# Patient Record
Sex: Male | Born: 1965 | Race: White | Hispanic: No | Marital: Married | Smoking: Former smoker
Health system: Southern US, Community
[De-identification: ages and names within clinical notes are randomized; demographics above are authoritative.]

## PROBLEM LIST (undated history)

## (undated) HISTORY — PX: KNEE ARTHROSCOPY WITH ANTERIOR CRUCIATE LIGAMENT (ACL) REPAIR: SHX5644

---

## 1997-08-13 ENCOUNTER — Other Ambulatory Visit: Admission: RE | Admit: 1997-08-13 | Discharge: 1997-08-13 | Payer: Self-pay | Admitting: *Deleted

## 1997-09-10 ENCOUNTER — Other Ambulatory Visit: Admission: RE | Admit: 1997-09-10 | Discharge: 1997-09-10 | Payer: Self-pay | Admitting: *Deleted

## 2011-06-17 ENCOUNTER — Emergency Department (INDEPENDENT_AMBULATORY_CARE_PROVIDER_SITE_OTHER)
Admission: EM | Admit: 2011-06-17 | Discharge: 2011-06-17 | Disposition: A | Discharge status: Home / Self Care | Payer: 59 | Source: Home / Self Care | Attending: Emergency Medicine | Admitting: Emergency Medicine

## 2011-06-17 DIAGNOSIS — R05 Cough: Secondary | ICD-10-CM

## 2011-06-17 DIAGNOSIS — J209 Acute bronchitis, unspecified: Secondary | ICD-10-CM

## 2011-06-17 MED ORDER — GUAIFENESIN-CODEINE 100-10 MG/5ML PO SYRP: 5.0000 mL | ORAL_SOLUTION | Freq: Four times a day (QID) | ORAL | Status: AC | PRN

## 2011-06-17 MED ORDER — CLARITHROMYCIN 500 MG PO TABS: 500.0000 mg | ORAL_TABLET | Freq: Two times a day (BID) | ORAL | Status: AC

## 2011-06-17 NOTE — ED Provider Notes (Signed)
History     CSN: [id number]  Arrival date & time 06/17/11  1015   First MD Initiated Contact with Patient 06/17/11 1028      No chief complaint on file.   (Consider location/radiation/quality/duration/timing/severity/associated sxs/prior treatment) HPI Clayton Romero is a 46 y.o. male who complains of onset of cold symptoms for 10 days.  No sore throat + cough (worsening last 2 days) No pleuritic pain No wheezing + nasal congestion + post-nasal drainage + sinus pain/pressure No chest congestion No itchy/red eyes No earache No hemoptysis No SOB No chills/sweats No fever No nausea No vomiting No abdominal pain No diarrhea No skin rashes No fatigue No myalgias No headache    No past medical history on file.  No past surgical history on file.  No family history on file.  History  Substance Use Topics  . Smoking status: Not on file  . Smokeless tobacco: Not on file  . Alcohol Use: Not on file      Review of Systems  Allergies  Review of patient's allergies indicates not on file.  Home Medications  No current outpatient prescriptions on file.  There were no vitals taken for this visit.  Physical Exam  Nursing note and vitals reviewed. Constitutional: He is oriented to person, place, and time. He appears well-developed and well-nourished.  HENT:  Head: Normocephalic and atraumatic.  Right Ear: Tympanic membrane, external ear and ear canal normal.  Left Ear: Tympanic membrane, external ear and ear canal normal.  Nose: Rhinorrhea present.  Mouth/Throat: Posterior oropharyngeal erythema present. No oropharyngeal exudate or posterior oropharyngeal edema.  Eyes: No scleral icterus.  Neck: Neck supple.  Cardiovascular: Regular rhythm and normal heart sounds.   Pulmonary/Chest: Effort normal and breath sounds normal. No apnea. No respiratory distress. He has no decreased breath sounds. He has no wheezes. He has no rhonchi.  Neurological: He is alert and oriented to  person, place, and time.  Skin: Skin is warm and dry.  Psychiatric: He has a normal mood and affect. His speech is normal.    ED Course  Procedures (including critical care time)  Labs Reviewed - No data to display No results found.   No diagnosis found.    MDM  1)  Take the prescribed antibiotic as instructed.  If worsening, advised to come back to clinic for a chest x-ray. 2)  Use nasal saline solution (over the counter) at least 3 times a day. 3)  Use over the counter decongestants like Zyrtec-D every 12 hours as needed to help with congestion.  If you have hypertension, do not take medicines with sudafed.  4)  Can take tylenol every 6 hours or motrin every 8 hours for pain or fever. 5)  Follow up with your primary doctor if no improvement in 5-7 days, sooner if increasing pain, fever, or new symptoms.      Lily Kocher, MD 06/17/11 [phone]

## 2011-06-17 NOTE — ED Notes (Signed)
Pt c/o productive cough for past 2 weeks.  Pt denies fever.

## 2013-11-01 ENCOUNTER — Encounter: Payer: Self-pay | Admitting: Emergency Medicine

## 2013-11-01 ENCOUNTER — Emergency Department (INDEPENDENT_AMBULATORY_CARE_PROVIDER_SITE_OTHER)
Admission: EM | Admit: 2013-11-01 | Discharge: 2013-11-01 | Disposition: A | Discharge status: Home / Self Care | Payer: 59 | Source: Home / Self Care | Attending: Family Medicine | Admitting: Family Medicine

## 2013-11-01 DIAGNOSIS — J069 Acute upper respiratory infection, unspecified: Secondary | ICD-10-CM

## 2013-11-01 MED ORDER — AZITHROMYCIN 250 MG PO TABS: ORAL_TABLET | ORAL | Status: DC

## 2013-11-01 MED ORDER — BENZONATATE 100 MG PO CAPS: 100.0000 mg | ORAL_CAPSULE | Freq: Three times a day (TID) | ORAL | Status: DC | PRN

## 2013-11-01 NOTE — ED Provider Notes (Signed)
CSN: [id number]     Arrival date & time 11/01/13  1223 History   First MD Initiated Contact with Patient 11/01/13 1241     Chief Complaint  Patient presents with  . URI    HPI  URI Symptoms Onset: 5-7 days  Description: rhinorrhea, nasal congestion, cough  Modifying factors:  + sick contacts at work with similar sxs   Symptoms Nasal discharge: yes Fever: no Sore throat: no Cough: yes Wheezing: no Ear pain: no GI symptoms: no Sick contacts: yes  Red Flags  Stiff neck: no Dyspnea: no Rash: no Swallowing difficulty: no  Sinusitis Risk Factors Headache/face pain: no Double sickening: no tooth pain: no  Allergy Risk Factors Sneezing: no Itchy scratchy throat: no Seasonal symptoms: no  Flu Risk Factors Headache: no muscle aches: no severe fatigue: no   History reviewed. No pertinent past medical history. Past Surgical History  Procedure Laterality Date  . Knee arthroscopy with anterior cruciate ligament (acl) repair     Family History  Problem Relation Age of Onset  . COPD Father    History  Substance Use Topics  . Smoking status: Former Games developermoker  . Smokeless tobacco: Not on file  . Alcohol Use: Yes     Comment: occasionally    Review of Systems  All other systems reviewed and are negative.   Allergies  Review of patient's allergies indicates no known allergies.  Home Medications   Prior to Admission medications   Medication Sig Start Date End Date Taking? Authorizing Provider  dextromethorphan-guaiFENesin (MUCINEX DM) 30-600 MG per 12 hr tablet Take 1 tablet by mouth every 12 (twelve) hours.    Historical Provider, MD  loratadine-pseudoephedrine (CLARITIN-D 24-HOUR) 10-240 MG per 24 hr tablet Take 1 tablet by mouth daily.    Historical Provider, MD   BP 133/89  Pulse 78  Temp(Src) 98.6 F (37 C) (Oral)  Ht 5\' 11"  (1.803 m)  Wt 220 lb (99.791 kg)  BMI 30.70 kg/m2  SpO2 97% Physical Exam  Constitutional: He is oriented to person, place, and  time. He appears well-developed and well-nourished.  HENT:  Head: Normocephalic and atraumatic.  Right Ear: External ear normal.  Left Ear: External ear normal.  +nasal erythema, rhinorrhea bilaterally, + post oropharyngeal erythema    Eyes: Conjunctivae are normal. Pupils are equal, round, and reactive to light.  Neck: Normal range of motion. Neck supple.  Cardiovascular: Normal rate and regular rhythm.   Pulmonary/Chest: Effort normal and breath sounds normal.  Abdominal: Soft.  Musculoskeletal: Normal range of motion.  Neurological: He is alert and oriented to person, place, and time.  Skin: Skin is warm.    ED Course  Procedures (including critical care time) Labs Review Labs Reviewed - No data to display  Imaging Review No results found.   MDM   1. URI (upper respiratory infection)    Likely viral source of sxs  Reassuring exam today Discussed supportive care and infectious/resp red flags.  Tessalon perles for cough ppx rx for zpak if sxs fail to improved in 7-10 days or if sxs worsen.  Follow up as needed.     The patient and/or caregiver has been counseled thoroughly with regard to treatment plan and/or medications prescribed including dosage, schedule, interactions, rationale for use, and possible side effects and they verbalize understanding. Diagnoses and expected course of recovery discussed and will return if not improved as expected or if the condition worsens. Patient and/or caregiver verbalized understanding.  Doree AlbeeSteven Han Vejar, MD 11/01/13 [phone]

## 2013-11-01 NOTE — ED Notes (Signed)
Sx of URI began one week ago with sore throat.  Sx have progressed to productive cough, body aches, head ache.

## 2013-11-03 ENCOUNTER — Telehealth: Payer: Self-pay

## 2013-11-03 NOTE — ED Notes (Signed)
Left a message on voice mail asking how patient is feeling and advising to call back with any questions or concerns.  

## 2014-07-16 ENCOUNTER — Emergency Department (INDEPENDENT_AMBULATORY_CARE_PROVIDER_SITE_OTHER)
Admission: EM | Admit: 2014-07-16 | Discharge: 2014-07-16 | Disposition: A | Discharge status: Home / Self Care | Payer: 59 | Source: Home / Self Care | Attending: Emergency Medicine | Admitting: Emergency Medicine

## 2014-07-16 ENCOUNTER — Encounter: Payer: Self-pay | Admitting: Emergency Medicine

## 2014-07-16 DIAGNOSIS — J111 Influenza due to unidentified influenza virus with other respiratory manifestations: Secondary | ICD-10-CM

## 2014-07-16 DIAGNOSIS — J1189 Influenza due to unidentified influenza virus with other manifestations: Secondary | ICD-10-CM

## 2014-07-16 MED ORDER — OSELTAMIVIR PHOSPHATE 75 MG PO CAPS: ORAL_CAPSULE | ORAL | Status: DC

## 2014-07-16 MED ORDER — BENZONATATE 200 MG PO CAPS: ORAL_CAPSULE | ORAL | Status: DC

## 2014-07-16 NOTE — ED Provider Notes (Signed)
CSN: [id number]     Arrival date & time 07/16/14  1537 History   First MD Initiated Contact with Patient 07/16/14 [phone]     Chief Complaint  Patient presents with  . Influenza   (Consider location/radiation/quality/duration/timing/severity/associated sxs/prior Treatment) HPI   HPI : Acute flu symptoms, onset 5 AM today. Fever to 102 with chills, sweats, myalgias, fatigue, headache. Symptoms are progressively worsening, despite trying OTC fever reducing medicine and rest and fluids. Has decreased appetite, but tolerating some liquids by mouth. No history of recent tick bite. His wife tested positive for influenza 2 days ago.  Review of Systems: Positive for fatigue, mild nasal congestion, mild sore throat, mild swollen anterior neck glands, mild cough, nonproductive. Negative for acute vision changes, stiff neck, focal weakness, syncope, seizures, respiratory distress, vomiting, diarrhea, GU symptoms, new Rash.  History reviewed. No pertinent past medical history. Past Surgical History  Procedure Laterality Date  . Knee arthroscopy with anterior cruciate ligament (acl) repair     Family History  Problem Relation Age of Onset  . COPD Father    History  Substance Use Topics  . Smoking status: Former Games developer  . Smokeless tobacco: Not on file  . Alcohol Use: Yes     Comment: occasionally    Review of Systems  All other systems reviewed and are negative.   Allergies  Review of patient's allergies indicates not on file.  Home Medications   Prior to Admission medications   Medication Sig Start Date End Date Taking? Authorizing Provider  benzonatate (TESSALON) 200 MG capsule Take 1 every 8 hours as needed for cough. 07/16/14   Lajean Manes, MD  oseltamivir (TAMIFLU) 75 MG capsule Starting today, take 1 capsule by mouth twice a day for 5 days. 07/16/14   Lajean Manes, MD   BP 111/79 mmHg  Pulse 85  Temp(Src) 99.1 F (37.3 C) (Oral)  Ht  (1.803 m)  Wt 214 lb (97.07 kg)   BMI 29.86 kg/m2  SpO2 99% Physical Exam  Constitutional: He appears well-developed and well-nourished.  Non-toxic appearance. He appears ill (very fatigued, but no cardiorespiratory distress). No distress.  HENT:  Head: Normocephalic and atraumatic.  Right Ear: Tympanic membrane and external ear normal.  Left Ear: Tympanic membrane and external ear normal.  Nose: Rhinorrhea present.  Mouth/Throat: Mucous membranes are normal. Posterior oropharyngeal erythema (mild redness ) present. No oropharyngeal exudate.  Eyes: Conjunctivae are normal. Right eye exhibits no discharge. Left eye exhibits no discharge. No scleral icterus.  Neck: Neck supple.  Cardiovascular: Normal rate, regular rhythm and normal heart sounds.   Pulmonary/Chest: Breath sounds normal. No stridor. No respiratory distress. He has no wheezes. He has no rales.  Abdominal: Soft. There is no tenderness.  Musculoskeletal: He exhibits no edema.  Lymphadenopathy:    He has cervical adenopathy (mild shoddy anterior cervical nodes).  Neurological: He is alert.  Skin: Skin is warm and intact. No rash noted. He is diaphoretic.  Psychiatric: He has a normal mood and affect.  Nursing note and vitals reviewed.   ED Course  Procedures (including critical care time) Labs Review Labs Reviewed - No data to display  Imaging Review No results found.   MDM   1. Influenza with respiratory manifestations    Options discussed. He declined any testing today. Treatment options discussed, as well as risks, benefits, alternatives. Patient voiced understanding and agreement with the following plans: benzonatate (TESSALON) 200 MG capsule Take 1 every 8 hours as needed for cough. 15 capsule  oseltamivir (TAMIFLU) 75 MG capsule Starting today, take 1 capsule by mouth twice a day for 5 days.   Follow-up with your primary care doctor in 5-7 days if not improving, or sooner if symptoms become worse. Precautions discussed. Red flags  discussed. Questions invited and answered. Patient voiced understanding and agreement.      Lajean Manesavid Massey, MD 07/16/14 [phone]

## 2014-07-16 NOTE — ED Notes (Signed)
This morning started feeling fatigue, cough, congestion, body aches, wife tested positive for flu 2 days ago

## 2018-06-08 DEATH — deceased

## 2019-12-10 ENCOUNTER — Other Ambulatory Visit: Payer: Self-pay

## 2019-12-10 ENCOUNTER — Emergency Department (INDEPENDENT_AMBULATORY_CARE_PROVIDER_SITE_OTHER)
Admission: EM | Admit: 2019-12-10 | Discharge: 2019-12-10 | Disposition: A | Discharge status: Home / Self Care | Payer: 59 | Source: Home / Self Care | Attending: Emergency Medicine | Admitting: Emergency Medicine

## 2019-12-10 DIAGNOSIS — R059 Cough, unspecified: Secondary | ICD-10-CM

## 2019-12-10 DIAGNOSIS — R05 Cough: Secondary | ICD-10-CM | POA: Diagnosis not present

## 2019-12-10 DIAGNOSIS — J209 Acute bronchitis, unspecified: Secondary | ICD-10-CM

## 2019-12-10 MED ORDER — PREDNISONE 10 MG (21) PO TBPK: ORAL_TABLET | ORAL | 0 refills | Status: DC

## 2019-12-10 MED ORDER — AZITHROMYCIN 250 MG PO TABS: ORAL_TABLET | ORAL | 0 refills | Status: DC

## 2019-12-10 NOTE — ED Triage Notes (Signed)
Patient presents to Urgent Care with complaints of cough since about a week ago. Patient reports it started when he was working outside when all the smoke from the [employer] was in the air. Pt has been trying otc medications but it has not helped his cough, is worse at night, nonproductive.

## 2019-12-10 NOTE — ED Provider Notes (Addendum)
[facility]    CSN: [id number] Arrival date & time: 12/10/19  1132      History   Chief Complaint Chief Complaint  Patient presents with  . Cough    HPI Clayton Romero is a 54 y.o. male.   HPI Patient presents to Urgent Care with complaints of cough since about a week ago. He  reports it started when he was working outside when all the smoke from the [employer] was in the air.  Started with chest congestion, frequent hacking nonproductive cough, but is occasionally productive of brownish-yellow sputum.  Chest muscles feel sore from coughing but no exertional chest pain.  No shortness of breath.   Pt has been trying otc medications but it has not helped his cough, is worse at night.   No change in taste or smell.   + Low-grade fever.  No definite chills or sweats. + myalgias, fatigue, mild nonfocal nonspecific headache.  Symptoms are progressively worsening, despite trying OTC fever reducing medicine and rest and fluids.  Has decreased appetite, but tolerating some liquids by mouth.  No history of recent tick bite. No history of travel within the last 2 weeks. No history of exposure to anyone with documented COVID-19  Past medical history: Denies history of chronic lung disease. Does not smoke.  Review of Systems  No  shortness of breath No  respiratory distress  + fatigue + Minimal nasal congestion + No sore throat + No swollen or painful neck glands  No  vomiting No  diarrhea No  acute vision changes No  stiff neck No  focal weakness No  syncope No  seizures No  GU symptoms No  new rash   Pertinent items noted in HPI and remainder of comprehensive ROS otherwise negative.   History reviewed. No pertinent past medical history.  There are no problems to display for this patient.   Past Surgical History:  Procedure Laterality Date  . KNEE ARTHROSCOPY WITH ANTERIOR CRUCIATE LIGAMENT (ACL) REPAIR         Home Medications     Prior to Admission medications   Medication Sig Start Date End Date Taking? Authorizing Provider  azithromycin (ZITHROMAX Z-PAK) 250 MG tablet Take 2 tablets on day one, then 1 tablet daily on days 2 through 5 12/10/19   Lajean Manes, MD  predniSONE (STERAPRED UNI-PAK 21 TAB) 10 MG (21) TBPK tablet Take tapering dosage over 6 days as directed 12/10/19   Lajean Manes, MD    Family History Family History  Problem Relation Age of Onset  . COPD Father   . Cancer Father   . Healthy Mother     Social History Social History   Tobacco Use  . Smoking status: Former Games developer  . Smokeless tobacco: Never Used  Substance Use Topics  . Alcohol use: Yes    Comment: occasionally  . Drug use: No     Allergies   Patient has no known allergies.   Review of Systems Review of Systems   Physical Exam Triage Vital Signs ED Triage Vitals  Enc Vitals Group     BP 12/10/19 1223 (!) 161/96     Pulse Rate 12/10/19 1223 84     Resp 12/10/19 1223 16     Temp 12/10/19 1223 99.7 F (37.6 C)     Temp Source 12/10/19 1223 Oral     SpO2 12/10/19 1223 99 %     Weight --      Height --  Head Circumference --      Peak Flow --      Pain Score 12/10/19 1220 0     Pain Loc --      Pain Edu? --      Excl. in GC? --    No data found.  Updated Vital Signs BP (!) 161/96 (BP Location: Right Arm)   Pulse 84   Temp 99.7 F (37.6 C) (Oral)   Resp 16   SpO2 99%    Physical Exam Vitals and nursing note reviewed.  BP repeated 148/92. Constitutional:      General: He is not in acute distress.    Appearance: He is well-developed. He is ill-appearing (very fatigued, but no cardiorespiratory distress) and mildly diaphoretic. He is not toxic-appearing.  Occasional hacking cough noted. HENT:     Head: Normocephalic and atraumatic.     Right Ear: Tympanic membrane and external ear normal.     Left Ear: Tympanic membrane and external ear normal.     Nose: Minimal clearish rhinorrhea present.      Mouth/Throat:     Pharynx: No posterior oropharyngeal erythema present. No oropharyngeal exudate.  Eyes:     General: No scleral icterus.       Right eye: No discharge.        Left eye: No discharge.     Conjunctiva/sclera: Conjunctivae normal.  Cardiovascular:     Rate and Rhythm: Normal rate and regular rhythm.     Heart sounds: Normal heart sounds.  No murmur.  No gallop or rub. Pulmonary: Oxygen saturation 99% room air.  Occasional harsh hacking cough noted.    Effort: No respiratory distress.     Breath sounds: Few scattered rhonchi bilaterally.  Breath sounds equal with good air movement bilaterally.  No stridor. No wheezing or rales.  Abdominal:     Palpations: Abdomen is soft.     Tenderness: There is no abdominal tenderness.  Musculoskeletal:     Cervical back: Neck supple.  Nontender.  No cyanosis clubbing edema or tenderness of lower extremities. Lymphadenopathy:     Cervical: Minimal cervical adenopathy (mild shoddy anterior cervical nodes) present.  Skin:    General: Skin is warm.     Findings: No rash.  Capillary refill normal. Neurological:     Mental Status: He is alert.  Cranial nerves intact.    UC Treatments / Results  Labs (all labs ordered are listed, but only abnormal results are displayed) Labs Reviewed  SARS CORONAVIRUS 2 (TAT 6-24 HRS)    Radiology No results found.  Procedures Procedures (including critical care time)  Medications Ordered in UC Medications - No data to display  Initial Impression / Assessment and Plan / UC Course  I have reviewed the triage vital signs and the nursing notes.  Pertinent labs & imaging results that were available during my care of the patient were reviewed by me and considered in my medical decision making (see chart for details).     Please see details of assessment and plan below in the discharge instructions. Final Clinical Impressions(s) / UC Diagnoses   Final diagnoses:  Cough  Acute bronchitis,  unspecified organism  Febrile illness.  Temp 99.7 here in urgent care.   Discharge Instructions     Based on your history and physical exam, you most likely have acute bronchitis, most likely from combination of [place]  And likely bacterial bronchitis.  No evidence of pneumonia after  listening to your lungs.-No chest x-ray is indicated at this time. Also, it is possible this could be Covid and the only way to tell for sure is to test. Plans: We are sending off a PCR nasal swab for Covid test, results hopefully should be back within 2 days.-In the meantime, I advise you quarantine.  Please see attached instruction sheet on "prevent the spread of COVID-19 if you are sick".  Also, see attached instruction sheet on acute bronchitis. -I am sending 2 prescriptions to your pharmacy.  Zithromax Z-Pak, which is an antibiotic.  Take as directed. Prednisone-6-day Dosepak which should help cough and lung inflammation. You may use over-the-counter Robitussin-DM if needed for cough syrup. Rest and drink plenty of fluids. You may return to work on Monday, 12/15/2019 if you are feeling better and if Covid test is negative.   He declined any prescription cough med.  He prefers to use OTC cough medicine as needed. Precautions discussed. Red flags discussed. Questions invited and answered. Patient voiced understanding and agreement.   ED Prescriptions    Medication Sig Dispense Auth. Provider   azithromycin (ZITHROMAX Z-PAK) 250 MG tablet Take 2 tablets on day one, then 1 tablet daily on days 2 through 5 1 each Lajean Manes, MD   predniSONE (STERAPRED UNI-PAK 21 TAB) 10 MG (21) TBPK tablet Take tapering dosage over 6 days as directed 21 tablet Lajean Manes, MD     PDMP not reviewed this encounter.   Lajean Manes, MD 12/10/19 Izola Price    Lajean Manes, MD 12/10/19 [phone]

## 2019-12-10 NOTE — Discharge Instructions (Addendum)
Based on your history and physical exam, you most likely have acute bronchitis, most likely from combination of [place]  And likely bacterial bronchitis.  No evidence of pneumonia after listening to your lungs.-No chest x-ray is indicated at this time. Also, it is possible this could be Covid and the only way to tell for sure is to test. Plans: We are sending off a PCR nasal swab for Covid test, results hopefully should be back within 2 days.-In the meantime, I advise you quarantine.  Please see attached instruction sheet on "prevent the spread of COVID-19 if you are sick".  Also, see attached instruction sheet on acute bronchitis. -I am sending 2 prescriptions to your pharmacy.  Zithromax Z-Pak, which is an antibiotic.  Take as directed. Prednisone-6-day Dosepak which should help cough and lung inflammation. You may use over-the-counter Robitussin-DM if needed for cough syrup. Rest and drink plenty of fluids. You may return to work on Monday, 12/15/2019 if you are feeling better and if Covid test is negative.

## 2019-12-11 LAB — SARS-COV-2 RNA,(COVID-19) QUALITATIVE NAAT: SARS CoV2 RNA: NOT DETECTED

## 2020-04-19 ENCOUNTER — Ambulatory Visit (INDEPENDENT_AMBULATORY_CARE_PROVIDER_SITE_OTHER): Payer: 59 | Admitting: Sports Medicine

## 2020-04-19 ENCOUNTER — Ambulatory Visit (INDEPENDENT_AMBULATORY_CARE_PROVIDER_SITE_OTHER): Payer: 59

## 2020-04-19 ENCOUNTER — Other Ambulatory Visit: Payer: Self-pay

## 2020-04-19 ENCOUNTER — Telehealth: Payer: Self-pay | Admitting: Sports Medicine

## 2020-04-19 DIAGNOSIS — M1731 Unilateral post-traumatic osteoarthritis, right knee: Secondary | ICD-10-CM

## 2020-04-19 DIAGNOSIS — M25461 Effusion, right knee: Secondary | ICD-10-CM | POA: Diagnosis not present

## 2020-04-19 DIAGNOSIS — S83281A Other tear of lateral meniscus, current injury, right knee, initial encounter: Secondary | ICD-10-CM

## 2020-04-19 DIAGNOSIS — S83241A Other tear of medial meniscus, current injury, right knee, initial encounter: Secondary | ICD-10-CM

## 2020-04-19 DIAGNOSIS — M7121 Synovial cyst of popliteal space [Baker], right knee: Secondary | ICD-10-CM | POA: Diagnosis not present

## 2020-04-19 NOTE — Progress Notes (Signed)
    Procedures performed today:    Procedure: Real-time Ultrasound Guided aspiration/injection of right knee Device: Samsung [id number]  Verbal informed consent obtained.  Time-out conducted.  Noted no overlying erythema, induration, or other signs of local infection.  Skin prepped in a sterile fashion.  Local anesthesia: Topical Ethyl chloride.  With sterile technique and under real time ultrasound guidance:  Using 18-gauge needle aspirated approximately 20 mils of clear, straw-colored fluid, syringe switched and 1 cc Kenalog 40, 2 cc lidocaine, 2 cc bupivacaine injected easily Completed without difficulty  Advised to call if fevers/chills, erythema, induration, drainage, or persistent bleeding.  Images permanently stored and available for review in PACS.  Impression: Technically successful ultrasound guided injection.  Independent interpretation of notes and tests performed by another provider:   None.  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 He was seen at an outside orthopedic group, steroid injection did not provide significant relief, this was over 2 months ago. Today he has a significant effusion so today we did a repeat aspiration and injection. X-rays did show significant osteoarthritis as well as the sequelae of his ACL reconstruction. He has also having some mechanical symptoms, due to mechanical symptoms, pain, recent x-rays, and symptoms after greater than 6 weeks of physician directed conservative measures we are going to proceed with MRI of the knee today. Return to see me in approximately a month, I am also going to work on getting him approved for viscosupplementation. He understands if all of the above fails we will consider referral for  arthroplasty.    ___________________________________________ Ihor Austin. Benjamin Stain, M.D., ABFM., CAQSM. Primary Care and Sports Medicine [facility]  Adjunct Instructor of Family Medicine  [employer]

## 2020-04-19 NOTE — Assessment & Plan Note (Signed)
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 He was seen at an outside orthopedic group, steroid injection did not provide significant relief, this was over 2 months ago. Today he has a significant effusion so today we did a repeat aspiration and injection. X-rays did show significant osteoarthritis as well as the sequelae of his ACL reconstruction. He has also having some mechanical symptoms, due to mechanical symptoms, pain, recent x-rays, and symptoms after greater than 6 weeks of physician directed conservative measures we are going to proceed with MRI of the knee today. Return to see me in approximately a month, I am also going to work on getting him approved for viscosupplementation. He understands if all of the above fails we will consider referral for arthroplasty.

## 2020-04-19 NOTE — Telephone Encounter (Signed)
Please work on approval for viscosupplementation, be it Orthovisc or any of the other preferred agents, right knee, x-ray confirmed, failed steroid injections

## 2020-04-23 ENCOUNTER — Telehealth: Payer: Self-pay | Admitting: Sports Medicine

## 2020-04-23 NOTE — Telephone Encounter (Signed)
Peer-to-peer performed, MRI of the knee approved, approval #[id number].

## 2020-04-29 NOTE — Telephone Encounter (Signed)
Started Orthovisc process waiting on BID and to see if prior authorization is required. - CF 

## 2020-05-03 NOTE — Telephone Encounter (Signed)
I called Clayton Romero and left him a voicemail to call me so we can go over his orthovisc. I received the benefits investigation and he will gave to pay for the injection and it requires a PA. - CF

## 2020-05-10 ENCOUNTER — Emergency Department: Admit: 2020-05-10 | Discharge status: Absent without leave | Payer: Self-pay

## 2020-05-10 ENCOUNTER — Telehealth: Payer: Self-pay

## 2020-05-10 ENCOUNTER — Telehealth: Payer: Self-pay | Admitting: Emergency Medicine

## 2020-05-10 ENCOUNTER — Other Ambulatory Visit: Payer: Self-pay

## 2020-05-10 ENCOUNTER — Emergency Department (INDEPENDENT_AMBULATORY_CARE_PROVIDER_SITE_OTHER)
Admission: EM | Admit: 2020-05-10 | Discharge: 2020-05-10 | Disposition: A | Discharge status: Home / Self Care | Payer: 59 | Source: Home / Self Care

## 2020-05-10 ENCOUNTER — Emergency Department (INDEPENDENT_AMBULATORY_CARE_PROVIDER_SITE_OTHER): Payer: 59

## 2020-05-10 DIAGNOSIS — U071 COVID-19: Secondary | ICD-10-CM

## 2020-05-10 DIAGNOSIS — J189 Pneumonia, unspecified organism: Secondary | ICD-10-CM | POA: Diagnosis not present

## 2020-05-10 DIAGNOSIS — J1282 Pneumonia due to coronavirus disease 2019: Secondary | ICD-10-CM

## 2020-05-10 LAB — POC SARS CORONAVIRUS 2 AG -  ED: SARS Coronavirus 2 Ag: POSITIVE — AB

## 2020-05-10 MED ORDER — ALBUTEROL SULFATE HFA 108 (90 BASE) MCG/ACT IN AERS: 1.0000 | INHALATION_SPRAY | Freq: Four times a day (QID) | RESPIRATORY_TRACT | 0 refills | Status: DC | PRN

## 2020-05-10 MED ORDER — ONDANSETRON 4 MG PO TBDP: 4.0000 mg | ORAL_TABLET | Freq: Three times a day (TID) | ORAL | 0 refills | Status: AC | PRN

## 2020-05-10 MED ORDER — DOXYCYCLINE HYCLATE 100 MG PO CAPS: 100.0000 mg | ORAL_CAPSULE | Freq: Two times a day (BID) | ORAL | 0 refills | Status: AC

## 2020-05-10 MED ORDER — PREDNISONE 50 MG PO TABS: 50.0000 mg | ORAL_TABLET | Freq: Every day | ORAL | 0 refills | Status: AC

## 2020-05-10 MED ORDER — ALBUTEROL SULFATE HFA 108 (90 BASE) MCG/ACT IN AERS: 1.0000 | INHALATION_SPRAY | Freq: Four times a day (QID) | RESPIRATORY_TRACT | 0 refills | Status: AC | PRN

## 2020-05-10 MED ORDER — ALBUTEROL SULFATE (2.5 MG/3ML) 0.083% IN NEBU: 2.5000 mg | INHALATION_SOLUTION | Freq: Four times a day (QID) | RESPIRATORY_TRACT | 12 refills | Status: DC | PRN

## 2020-05-10 MED ORDER — ACETAMINOPHEN 325 MG PO TABS
650.0000 mg | ORAL_TABLET | Freq: Four times a day (QID) | ORAL | Status: DC | PRN
  Administered 2020-05-10 (×2): 650 mg via ORAL

## 2020-05-10 MED ORDER — ALBUTEROL SULFATE (5 MG/ML) 0.5% IN NEBU: 2.5000 mg | INHALATION_SOLUTION | Freq: Four times a day (QID) | RESPIRATORY_TRACT | 12 refills | Status: AC | PRN

## 2020-05-10 NOTE — Discharge Instructions (Signed)
°  Because your pneumonia is due to the COVID virus, the antibiotic may or may not help but it is important to finish the entire course, even if you start to feel better to cover for bacteria infections.  A referral for an antibody infusion clinic has been placed. Someone should call you within the next 48 hours to discuss this potential treatment with you.   Call 911 or have someone drive you to the hospital if symptoms significantly worsening- worsening trouble breathing, dizziness/passing out, unable to keep down fluids or other new concerning symptoms develop.

## 2020-05-10 NOTE — ED Provider Notes (Addendum)
[facility]    CSN: [id number] Arrival date & time: 05/10/20  1041      History   Chief Complaint Chief Complaint  Patient presents with  . Cough    HPI Clayton Romero is a 55 y.o. male.   HPI  Clayton Romero is a 56 y.o. male presenting to UC with c/o 3 day of fever Tmax 101*F, preceded a few days earlier of mild cough and fatigue.  Pt c/o body aches, fatigue, SOB with exertion, stating his breathing "is not the best in the world right now."  He has taken OTC cough/cold medication with mild relief.  He was exposed to his brother who tested positive for COVID recently. Pt has not received the COVID or flu vaccines.  No hx of asthma but states "my lungs don't always work the best." father had COPD.  Pt is a former smoker.   History reviewed. No pertinent past medical history.  Patient Active Problem List   Diagnosis Date Noted  . Post-traumatic osteoarthritis of right knee 04/19/2020    Past Surgical History:  Procedure Laterality Date  . KNEE ARTHROSCOPY WITH ANTERIOR CRUCIATE LIGAMENT (ACL) REPAIR         Home Medications    Prior to Admission medications   Medication Sig Start Date End Date Taking? Authorizing Provider  albuterol (PROVENTIL) (2.5 MG/3ML) 0.083% nebulizer solution Take 3 mLs (2.5 mg total) by nebulization every 6 (six) hours as needed for wheezing or shortness of breath. 05/10/20  Yes Dorenda Pfannenstiel O, PA-C  doxycycline (VIBRAMYCIN) 100 MG capsule Take 1 capsule (100 mg total) by mouth 2 (two) times daily for 7 days. 05/10/20 05/17/20 Yes Muneeb Veras O, PA-C  ondansetron (ZOFRAN-ODT) 4 MG disintegrating tablet Take 1 tablet (4 mg total) by mouth every 8 (eight) hours as needed for nausea or vomiting. 05/10/20  Yes Omarion Minnehan O, PA-C  predniSONE (DELTASONE) 50 MG tablet Take 1 tablet (50 mg total) by mouth daily with breakfast for 5 days. 05/10/20 05/15/20 Yes Lurene Shadow, PA-C    Family History Family History  Problem Relation Age of  Onset  . COPD Father   . Cancer Father   . Healthy Mother     Social History Social History   Tobacco Use  . Smoking status: Former Games developer  . Smokeless tobacco: Never Used  Substance Use Topics  . Alcohol use: Yes    Comment: occasionally  . Drug use: No     Allergies   Patient has no known allergies.   Review of Systems Review of Systems  Constitutional: Positive for fatigue and fever. Negative for chills.  HENT: Positive for congestion. Negative for ear pain, sore throat, trouble swallowing and voice change.   Respiratory: Positive for cough, chest tightness and shortness of breath (intermittent).   Cardiovascular: Negative for chest pain and palpitations.  Gastrointestinal: Negative for abdominal pain, diarrhea, nausea and vomiting.  Musculoskeletal: Positive for arthralgias, back pain and myalgias.  Skin: Negative for rash.  Neurological: Positive for headaches. Negative for dizziness and light-headedness.  All other systems reviewed and are negative.    Physical Exam Triage Vital Signs ED Triage Vitals  Enc Vitals Group     BP 05/10/20 1050 130/76     Pulse Rate 05/10/20 1050 (!) 114     Resp 05/10/20 1050 18     Temp 05/10/20 1050 (!) 100.9 F (38.3 C)     Temp Source 05/10/20 1050 Oral     SpO2  05/10/20 1050 99 %     Weight --      Height --      Head Circumference --      Peak Flow --      Pain Score 05/10/20 1048 0     Pain Loc --      Pain Edu? --      Excl. in GC? --    No data found.  Updated Vital Signs BP 130/76 (BP Location: Right Arm)   Pulse (!) 114   Temp (!) 100.9 F (38.3 C) (Oral)   Resp 18   Ht 5\' 11"  (1.803 m)   Wt 230 lb (104.3 kg)   SpO2 99%   BMI 32.08 kg/m   Visual Acuity Right Eye Distance:   Left Eye Distance:   Bilateral Distance:    Right Eye Near:   Left Eye Near:    Bilateral Near:     Physical Exam Vitals and nursing note reviewed.  Constitutional:      General: He is not in acute distress.     Appearance: Normal appearance. He is well-developed and well-nourished. He is not ill-appearing, toxic-appearing or diaphoretic.  HENT:     Head: Normocephalic and atraumatic.     Right Ear: Tympanic membrane and ear canal normal.     Left Ear: Tympanic membrane and ear canal normal.     Nose: Nose normal.     Right Sinus: No maxillary sinus tenderness or frontal sinus tenderness.     Left Sinus: No maxillary sinus tenderness or frontal sinus tenderness.     Mouth/Throat:     Lips: Pink.     Mouth: Mucous membranes are moist.     Pharynx: Oropharynx is clear. Uvula midline.  Eyes:     Extraocular Movements: EOM normal.  Cardiovascular:     Rate and Rhythm: Regular rhythm. Tachycardia present.  Pulmonary:     Effort: Pulmonary effort is normal. No respiratory distress.     Breath sounds: No stridor. Wheezing (right middle lobe) and rhonchi present. No rales.  Musculoskeletal:        General: Normal range of motion.     Cervical back: Normal range of motion.  Skin:    General: Skin is warm and dry.  Neurological:     Mental Status: He is alert and oriented to person, place, and time.  Psychiatric:        Mood and Affect: Mood and affect normal.        Behavior: Behavior normal.      UC Treatments / Results  Labs (all labs ordered are listed, but only abnormal results are displayed) Labs Reviewed  POC SARS CORONAVIRUS 2 AG -  ED - Abnormal; Notable for the following components:      Result Value   SARS Coronavirus 2 Ag Positive (*)    All other components within normal limits    EKG   Radiology DG Chest 2 View  Result Date: 05/10/2020 CLINICAL DATA:  Cough and congestion for several days, history of known COVID exposure, positive COVID test today. EXAM: CHEST - 2 VIEW COMPARISON:  None. FINDINGS: Cardiac shadow is within normal limits. Patchy airspace opacities are noted bilaterally consistent with multifocal pneumonia. No sizable effusion is seen. No bony abnormality is  noted. IMPRESSION: Patchy bilateral airspace opacities consistent with the given clinical history of COVID-19 positivity. Electronically Signed   By: 07/08/2020 M.D.   On: 05/10/2020 11:29    Procedures Procedures (including critical  care time)  Medications Ordered in UC Medications  acetaminophen (TYLENOL) tablet 650 mg (650 mg Oral Given 05/10/20 1150)    Initial Impression / Assessment and Plan / UC Course  I have reviewed the triage vital signs and the nursing notes.  Pertinent labs & imaging results that were available during my care of the patient were reviewed by me and considered in my medical decision making (see chart for details).    Pt reports intermittent SOB but no respiratory distress noted on exam O2 Sat 99% on RA Rapid COVID: POSITIVE Will cover for secondary bacterial pneumonia Referral to [place] clinic placed, pt open to discussing this treatment. Discussed symptoms that warrant emergent care in the ED. AVS given  Final Clinical Impressions(s) / UC Diagnoses   Final diagnoses:  Multifocal pneumonia  Pneumonia due to COVID-19 virus     Discharge Instructions      Because your pneumonia is due to the COVID virus, the antibiotic may or may not help but it is important to finish the entire course, even if you start to feel better to cover for bacteria infections.  A referral for an antibody infusion clinic has been placed. Someone should call you within the next 48 hours to discuss this potential treatment with you.   Call 911 or have someone drive you to the hospital if symptoms significantly worsening- worsening trouble breathing, dizziness/passing out, unable to keep down fluids or other new concerning symptoms develop.      ED Prescriptions    Medication Sig Dispense Auth. Provider   doxycycline (VIBRAMYCIN) 100 MG capsule Take 1 capsule (100 mg total) by mouth 2 (two) times daily for 7 days. 14 capsule Gerarda Fraction, Johana Hopkinson O, PA-C   predniSONE (DELTASONE) 50  MG tablet Take 1 tablet (50 mg total) by mouth daily with breakfast for 5 days. 5 tablet Gerarda Fraction, Lenell Lama O, PA-C   albuterol (PROVENTIL) (2.5 MG/3ML) 0.083% nebulizer solution Take 3 mLs (2.5 mg total) by nebulization every 6 (six) hours as needed for wheezing or shortness of breath. 75 mL Blain Hunsucker O, PA-C   ondansetron (ZOFRAN-ODT) 4 MG disintegrating tablet Take 1 tablet (4 mg total) by mouth every 8 (eight) hours as needed for nausea or vomiting. 12 tablet Noe Gens, [place]     PDMP not reviewed this encounter.   Noe Gens, PA-C 05/10/20 1203    Noe Gens, [place] 05/10/20 1203

## 2020-05-10 NOTE — Telephone Encounter (Signed)
Printer error. Printed successfully on prescription paper.

## 2020-05-10 NOTE — Telephone Encounter (Signed)
[facility]ebulizer called into [facility] in [place] for patient.

## 2020-05-10 NOTE — Telephone Encounter (Signed)
Nebulizer solution inadvertantly sent to pharmacy, pt does not have nebulizer machine. Albuterol inhaler sent to [facility] on [address] in [place].

## 2020-05-10 NOTE — Telephone Encounter (Signed)
Albuterol not at patient's preferred pharmacy. Prescription printed for pt.

## 2020-05-10 NOTE — ED Triage Notes (Signed)
Patient presents to Urgent Care with complaints of intermittent fever and cough since 3 days ago. Patient reports he feels like his breathing "is not the best in the world right now". Pt states he has been taking various medications at home, was exposed to his brother who has covid.

## 2020-05-11 ENCOUNTER — Telehealth: Payer: Self-pay | Admitting: Emergency Medicine

## 2020-05-11 NOTE — Telephone Encounter (Signed)
Call from patient Clayton Romero) regarding nebulizer- confirmed identifiers. RN had spoken to pharmacy tech at [facility] earlier who stated they had the nebulizer but would not be able to charge for machine via insurance. RN spoke to patient and determined it would be easier for patient to pick up nebulizer from [facility] - form filled out by RN & patient on picking up nebulizer today. Forms placed on desk for provider to sign. Forms to be faxed back to [facility]

## 2020-05-12 ENCOUNTER — Telehealth: Payer: Self-pay | Admitting: Family

## 2020-05-12 NOTE — Telephone Encounter (Signed)
Called to discuss with patient about COVID-19 symptoms and the use of one of the available treatments for those with mild to moderate Covid symptoms and at a high risk of hospitalization.  Pt appears to qualify for outpatient treatment due to co-morbid conditions and/or a member of an at-risk group in accordance with the FDA Emergency Use Authorization.    Symptom onset: More than 1 week ago Vaccinated: No Qualifiers: BMI>25  I spoke with Mr. Buch regarding potential treatment with EUA medications. Due to symptoms starting over 1 week ago, he currently does not meet the requirements for treatment. He also expressed increasing difficulty with breathing and worsening symptoms. He was encouraged to seek further care in the ED due to his shortness of breath.   Marcos Eke, NP 05/12/2020 10:04 AM

## 2020-05-17 ENCOUNTER — Ambulatory Visit: Payer: 59 | Admitting: Sports Medicine

## 2020-06-08 DEATH — deceased

## 2022-02-05 IMAGING — DX DG CHEST 2V
2 series · 2 of 2 positions shown · non-contrast
Comparison: None.

CLINICAL DATA: Cough and congestion for several days, history of
known COVID exposure, positive COVID test today.

EXAM:
CHEST - 2 VIEW

[chest pa]
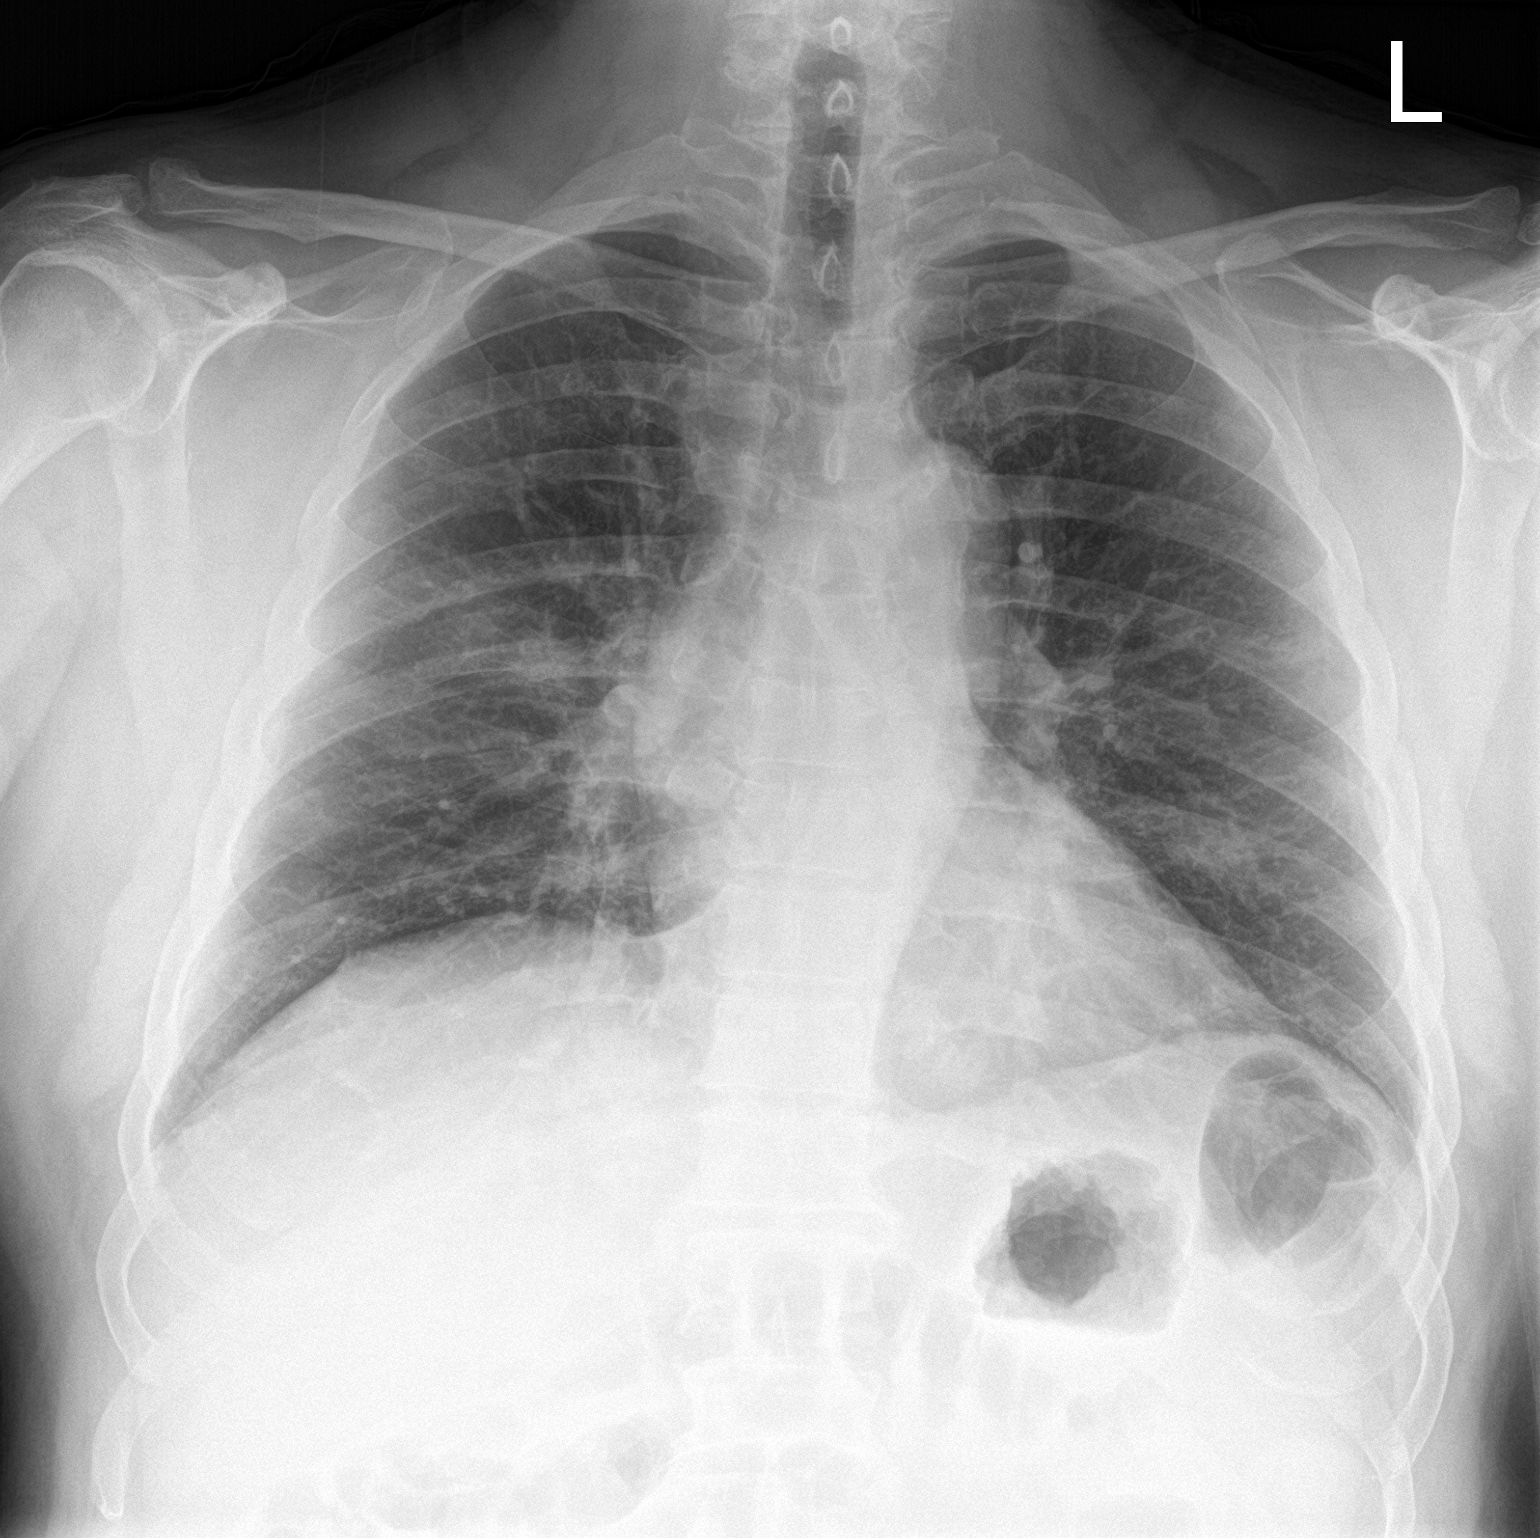

[chest lat]
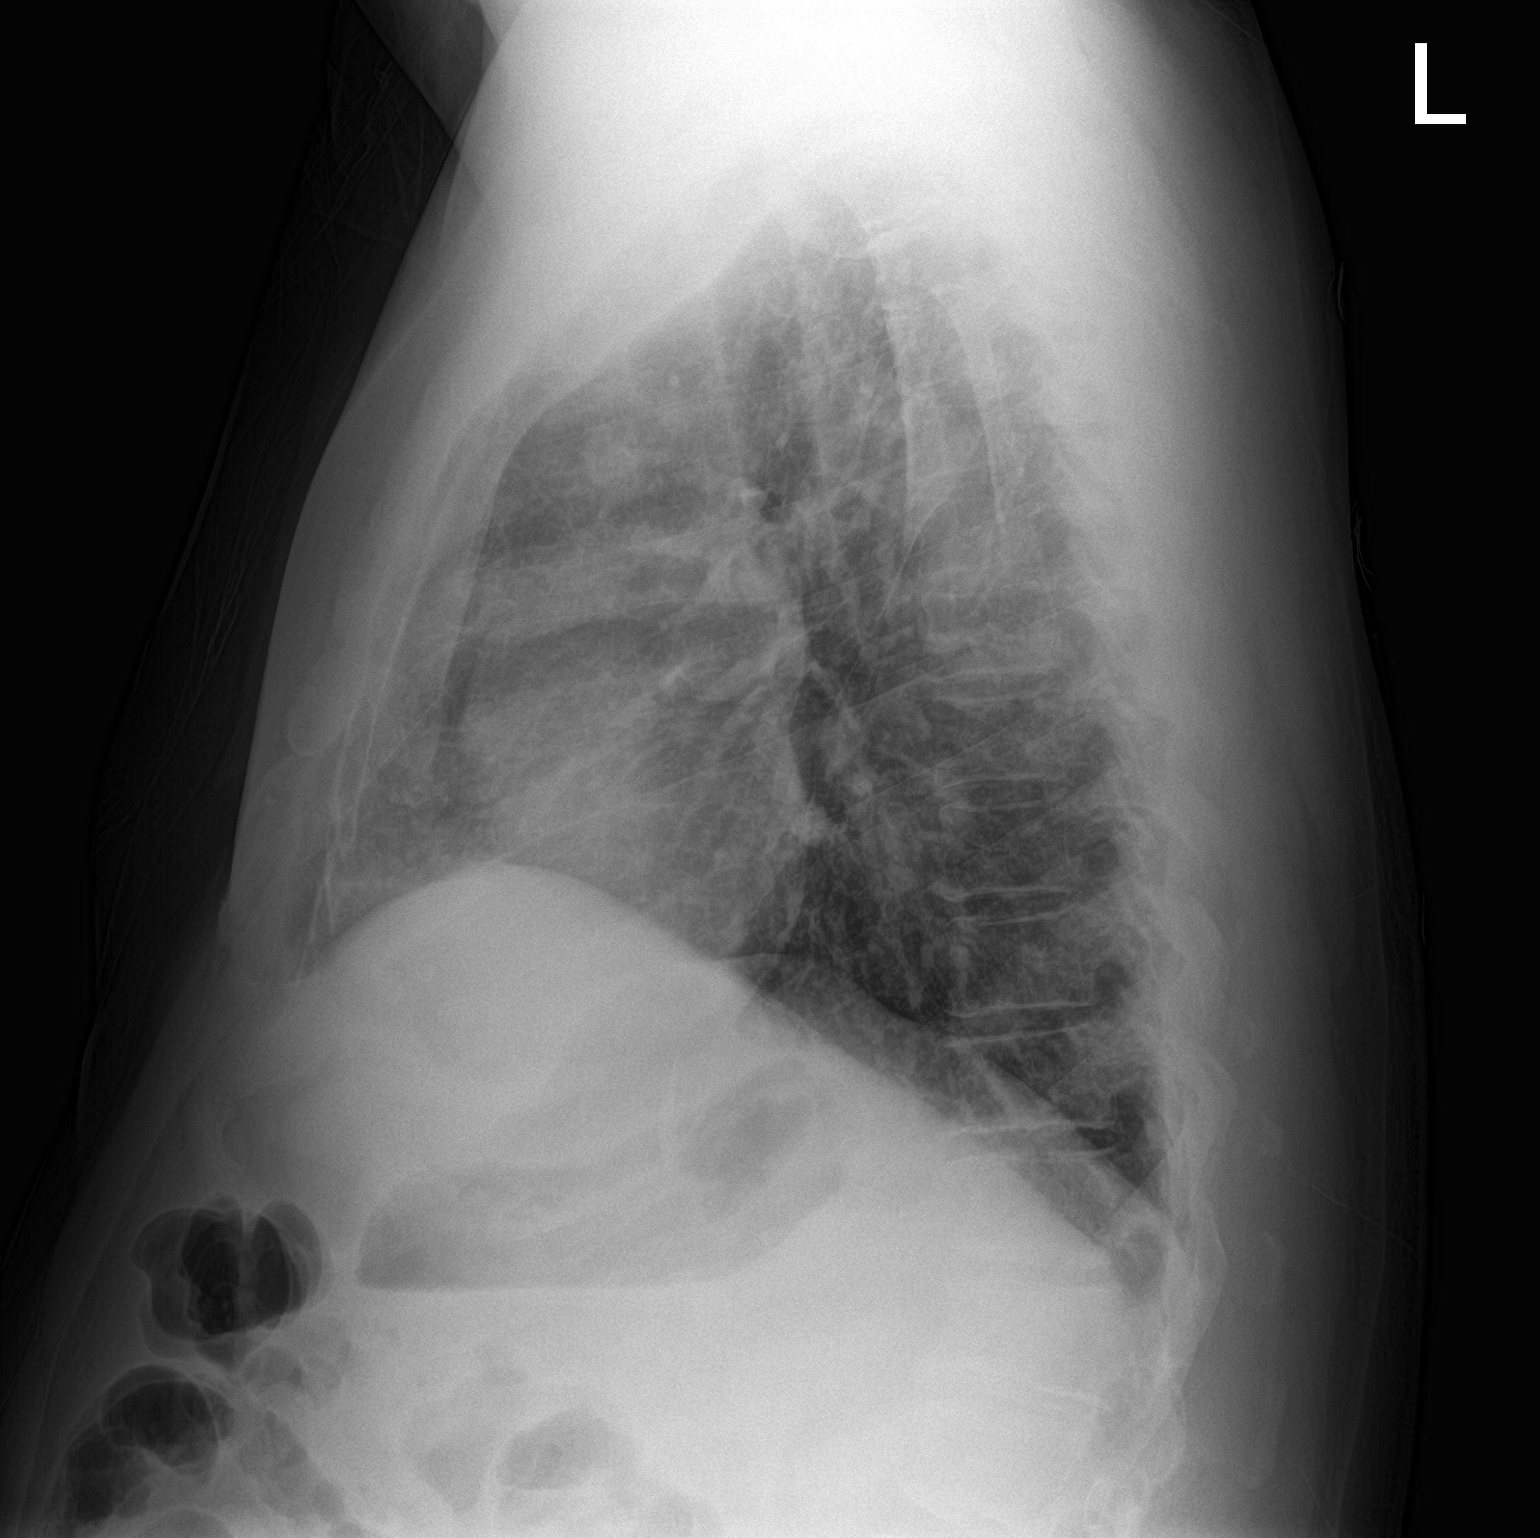

[2 of 2 positions shown; findings below may reference images not displayed]

FINDINGS: Cardiac shadow is within normal limits. Patchy airspace opacities
are noted bilaterally consistent with multifocal pneumonia. No
sizable effusion is seen. No bony abnormality is noted.
IMPRESSION: Patchy bilateral airspace opacities consistent with the given
clinical history of MSKSJ-BV positivity.
